# Patient Record
Sex: Male | Born: 1960 | Race: Black or African American | Hispanic: No | Marital: Single | Smoking: Current every day smoker
Health system: Southern US, Community
[De-identification: ages and names within clinical notes are randomized; demographics above are authoritative.]

---

## 2007-02-04 ENCOUNTER — Emergency Department (HOSPITAL_COMMUNITY): Admission: EM | Admit: 2007-02-04 | Discharge: 2007-02-04 | Payer: Self-pay | Admitting: Emergency Medicine

## 2020-01-13 ENCOUNTER — Ambulatory Visit: Payer: Self-pay | Attending: Internal Medicine

## 2020-01-13 DIAGNOSIS — Z23 Encounter for immunization: Secondary | ICD-10-CM

## 2020-01-13 NOTE — Progress Notes (Signed)
   Covid-19 Vaccination Clinic  Name:  Edgar Roy    MRN: [id number] DOB: [birth year]  01/13/2020  Edgar Roy was observed post Covid-19 immunization for 15 minutes without incident. He was provided with Vaccine Information Sheet and instruction to access the V-Safe system.   Edgar Roy was instructed to call 911 with any severe reactions post vaccine: Marland Kitchen Difficulty breathing  . Swelling of face and throat  . A fast heartbeat  . A bad rash all over body  . Dizziness and weakness   Immunizations Administered    Name Date Dose VIS Date Route   Moderna COVID-19 Vaccine 01/13/2020 10:35 AM 0.5 mL 09/05/2019 Intramuscular   Manufacturer: Moderna   Lot: [id number]   NDC: 94854-627-03

## 2020-02-10 ENCOUNTER — Ambulatory Visit: Payer: Self-pay | Attending: Internal Medicine

## 2020-02-10 DIAGNOSIS — Z23 Encounter for immunization: Secondary | ICD-10-CM

## 2020-02-10 NOTE — Progress Notes (Signed)
   Covid-19 Vaccination Clinic  Name:  Edgar Roy    MRN: [id number] DOB: 1961-02-12  02/10/2020  [name] was observed post Covid-19 immunization for 15 minutes without incident. He was provided with Vaccine Information Sheet and instruction to access the V-Safe system.   [name] was instructed to call 911 with any severe reactions post vaccine: Marland Kitchen Difficulty breathing  . Swelling of face and throat  . A fast heartbeat  . A bad rash all over body  . Dizziness and weakness   Immunizations Administered    Name Date Dose VIS Date Route   Moderna COVID-19 Vaccine 02/10/2020 10:18 AM 0.5 mL 09/2019 Intramuscular   Manufacturer: Moderna   Lot: [id number]   NDC: 84665-993-57

## 2020-03-05 ENCOUNTER — Emergency Department (HOSPITAL_COMMUNITY)
Admission: EM | Admit: 2020-03-05 | Discharge: 2020-03-05 | Disposition: A | Discharge status: Home / Self Care | Payer: No Typology Code available for payment source | Attending: Emergency Medicine | Admitting: Emergency Medicine

## 2020-03-05 ENCOUNTER — Emergency Department (HOSPITAL_COMMUNITY): Payer: No Typology Code available for payment source

## 2020-03-05 ENCOUNTER — Other Ambulatory Visit: Payer: Self-pay

## 2020-03-05 ENCOUNTER — Encounter (HOSPITAL_COMMUNITY): Payer: Self-pay | Admitting: Emergency Medicine

## 2020-03-05 DIAGNOSIS — M542 Cervicalgia: Secondary | ICD-10-CM

## 2020-03-05 DIAGNOSIS — E049 Nontoxic goiter, unspecified: Secondary | ICD-10-CM | POA: Insufficient documentation

## 2020-03-05 DIAGNOSIS — F17210 Nicotine dependence, cigarettes, uncomplicated: Secondary | ICD-10-CM | POA: Insufficient documentation

## 2020-03-05 DIAGNOSIS — I10 Essential (primary) hypertension: Secondary | ICD-10-CM | POA: Insufficient documentation

## 2020-03-05 MED ORDER — IBUPROFEN 400 MG PO TABS
600.0000 mg | ORAL_TABLET | Freq: Once | ORAL | Status: AC
  Administered 2020-03-05: 600 mg via ORAL
  Filled 2020-03-05: qty 2

## 2020-03-05 MED ORDER — METHOCARBAMOL 500 MG PO TABS: 500.0000 mg | ORAL_TABLET | Freq: Three times a day (TID) | ORAL | 0 refills | Status: AC | PRN
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
## 2020-03-05 NOTE — Discharge Instructions (Addendum)
Your thyroid is enlarged.  You need to follow up with a primary doctor for an ultrasound to further evaluate this, make sure the thyroid is functioning normally and get an ultrasound to make sure there is no evidence of cancer or other serious cause.   Please take Ibuprofen (Advil, motrin) and Tylenol (acetaminophen) to relieve your pain.  You may take up to 600 MG (3 pills) of normal strength ibuprofen every 8 hours as needed.  In between doses of ibuprofen you make take tylenol, up to 1,000 mg (two extra strength pills).  Do not take more than 3,000 mg tylenol in a 24 hour period.  Please check all medication labels as many medications such as pain and cold medications may contain tylenol.  Do not drink alcohol while taking these medications.  Do not take other NSAID'S while taking ibuprofen (such as aleve or naproxen).  Please take ibuprofen with food to decrease stomach upset.  You are being prescribed a medication which may make you sleepy. For 24 hours after one dose please do not drive, operate heavy machinery, care for a small child with out another adult present, or perform any activities that may cause harm to you or someone else if you were to fall asleep or be impaired.

## 2020-03-05 NOTE — ED Notes (Signed)
Transported to CT 
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
## 2024-07-12 ENCOUNTER — Emergency Department (HOSPITAL_COMMUNITY)
Admission: EM | Admit: 2024-07-12 | Discharge: 2024-07-12 | Disposition: A | Discharge status: Home / Self Care | Attending: Emergency Medicine | Admitting: Emergency Medicine

## 2024-07-12 ENCOUNTER — Emergency Department (HOSPITAL_COMMUNITY)

## 2024-07-12 ENCOUNTER — Encounter (HOSPITAL_COMMUNITY): Payer: Self-pay

## 2024-07-12 ENCOUNTER — Other Ambulatory Visit: Payer: Self-pay

## 2024-07-12 DIAGNOSIS — W208XXA Other cause of strike by thrown, projected or falling object, initial encounter: Secondary | ICD-10-CM | POA: Insufficient documentation

## 2024-07-12 DIAGNOSIS — M79672 Pain in left foot: Secondary | ICD-10-CM | POA: Insufficient documentation

## 2024-07-12 MED ORDER — OXYCODONE-ACETAMINOPHEN 5-325 MG PO TABS
1.0000 | ORAL_TABLET | Freq: Once | ORAL | Status: AC
  Administered 2024-07-12: 1 via ORAL
  Filled 2024-07-12: qty 1

## 2024-07-12 MED ORDER — IBUPROFEN 800 MG PO TABS: 800.0000 mg | ORAL_TABLET | Freq: Three times a day (TID) | ORAL | 1 refills | Status: AC | PRN

## 2024-07-12 NOTE — ED Provider Notes (Signed)
  [place] EMERGENCY DEPARTMENT AT [facility] Provider Note   CSN: [id number] Arrival date & time: 07/12/24  9268     Patient presents with: Foot Pain   Edgar Roy is a 63 y.o. male.  {Add pertinent medical, surgical, social history, OB history to YEP:67052} Patient complains of pain on top of his left foot.   Foot Pain       Prior to Admission medications   Medication Sig Start Date End Date Taking? Authorizing Provider  ibuprofen  (ADVIL ) 800 MG tablet Take 1 tablet (800 mg total) by mouth every 8 (eight) hours as needed for moderate pain (pain score 4-6). 07/12/24  Yes Suzette Pac, MD  methocarbamol  (ROBAXIN ) 500 MG tablet Take 1 tablet (500 mg total) by mouth every 8 (eight) hours as needed for muscle spasms. 03/05/20   Windle Almarie ORN, PA-C    Allergies: Patient has no known allergies.    Review of Systems  Updated Vital Signs BP (!) 168/99   Pulse 66   Temp 98.2 F (36.8 C)   Resp 18   Ht 5' 8 (1.727 m)   Wt 77.1 kg   SpO2 94%   BMI 25.85 kg/m   Physical Exam  (all labs ordered are listed, but only abnormal results are displayed) Labs Reviewed - No data to display  EKG: None  Radiology: DG Foot Complete Left Result Date: 07/12/2024 CLINICAL DATA:  Pain. Pain on the top of the left foot. Ladder fell on foot. EXAM: LEFT FOOT - COMPLETE 3+ VIEW COMPARISON:  None Available. FINDINGS: Negative for a fracture or dislocation. Normal alignment. Spurring at the medial malleolus. No focal soft tissue abnormality. Vascular calcifications in ankle and calf. IMPRESSION: No acute bone abnormality to the left foot. Electronically Signed   By: Juliene Balder M.D.   On: 07/12/2024 08:39    {Document cardiac monitor, telemetry assessment procedure when appropriate:32947} Procedures   Medications Ordered in the ED  oxyCODONE-acetaminophen (PERCOCET/ROXICET) 5-325 MG per tablet 1 tablet (1 tablet Oral Given 07/12/24 9077)      {Click here for ABCD2,  HEART and other calculators REFRESH Note before signing:1}                              Medical Decision Making Amount and/or Complexity of Data Reviewed Radiology: ordered.  Risk Prescription drug management.   X-ray unremarkable.  Patient with inflammation to left foot.  He will be placed on Motrin  and follow-up with orthopedics.  Patient also has mildly elevated blood pressure and will follow-up with his PCP  {Document critical care time when appropriate  Document review of labs and clinical decision tools ie CHADS2VASC2, etc  Document your independent review of radiology images and any outside records  Document your discussion with family members, caretakers and with consultants  Document social determinants of health affecting pt's care  Document your decision making why or why not admission, treatments were needed:32947:::1}   Final diagnoses:  Foot pain, left    ED Discharge Orders          Ordered    ibuprofen  (ADVIL ) 800 MG tablet  Every 8 hours PRN        07/12/24 1112

## 2024-07-12 NOTE — Discharge Instructions (Signed)
 Follow-up with your family doctor to recheck your blood pressure and follow-up with Dr. Margrette in the next couple weeks for your foot if not improving
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
